# Patient Record
Sex: Male | Born: 1965 | Race: Black or African American | Hispanic: No | Marital: Single | Smoking: Current every day smoker
Health system: Southern US, Community
[De-identification: ages and names within clinical notes are randomized; demographics above are authoritative.]

---

## 2004-06-03 ENCOUNTER — Emergency Department: Payer: Self-pay | Admitting: General Practice

## 2007-02-14 ENCOUNTER — Emergency Department: Payer: Self-pay | Admitting: Emergency Medicine

## 2007-02-15 ENCOUNTER — Other Ambulatory Visit: Payer: Self-pay

## 2008-04-24 IMAGING — CR DG CHEST 1V
1 series · 1 of 1 positions shown · non-contrast
Comparison: none

REASON FOR EXAM: Dizziness
COMMENTS:

PROCEDURE:     DXR - DXR CHEST 1 VIEWAP OR PA  - February 15, 2007 [DATE]
RESULT:     The lung fields are clear. The heart, mediastinal and osseous
structures show no significant abnormalities.

[view not recorded]
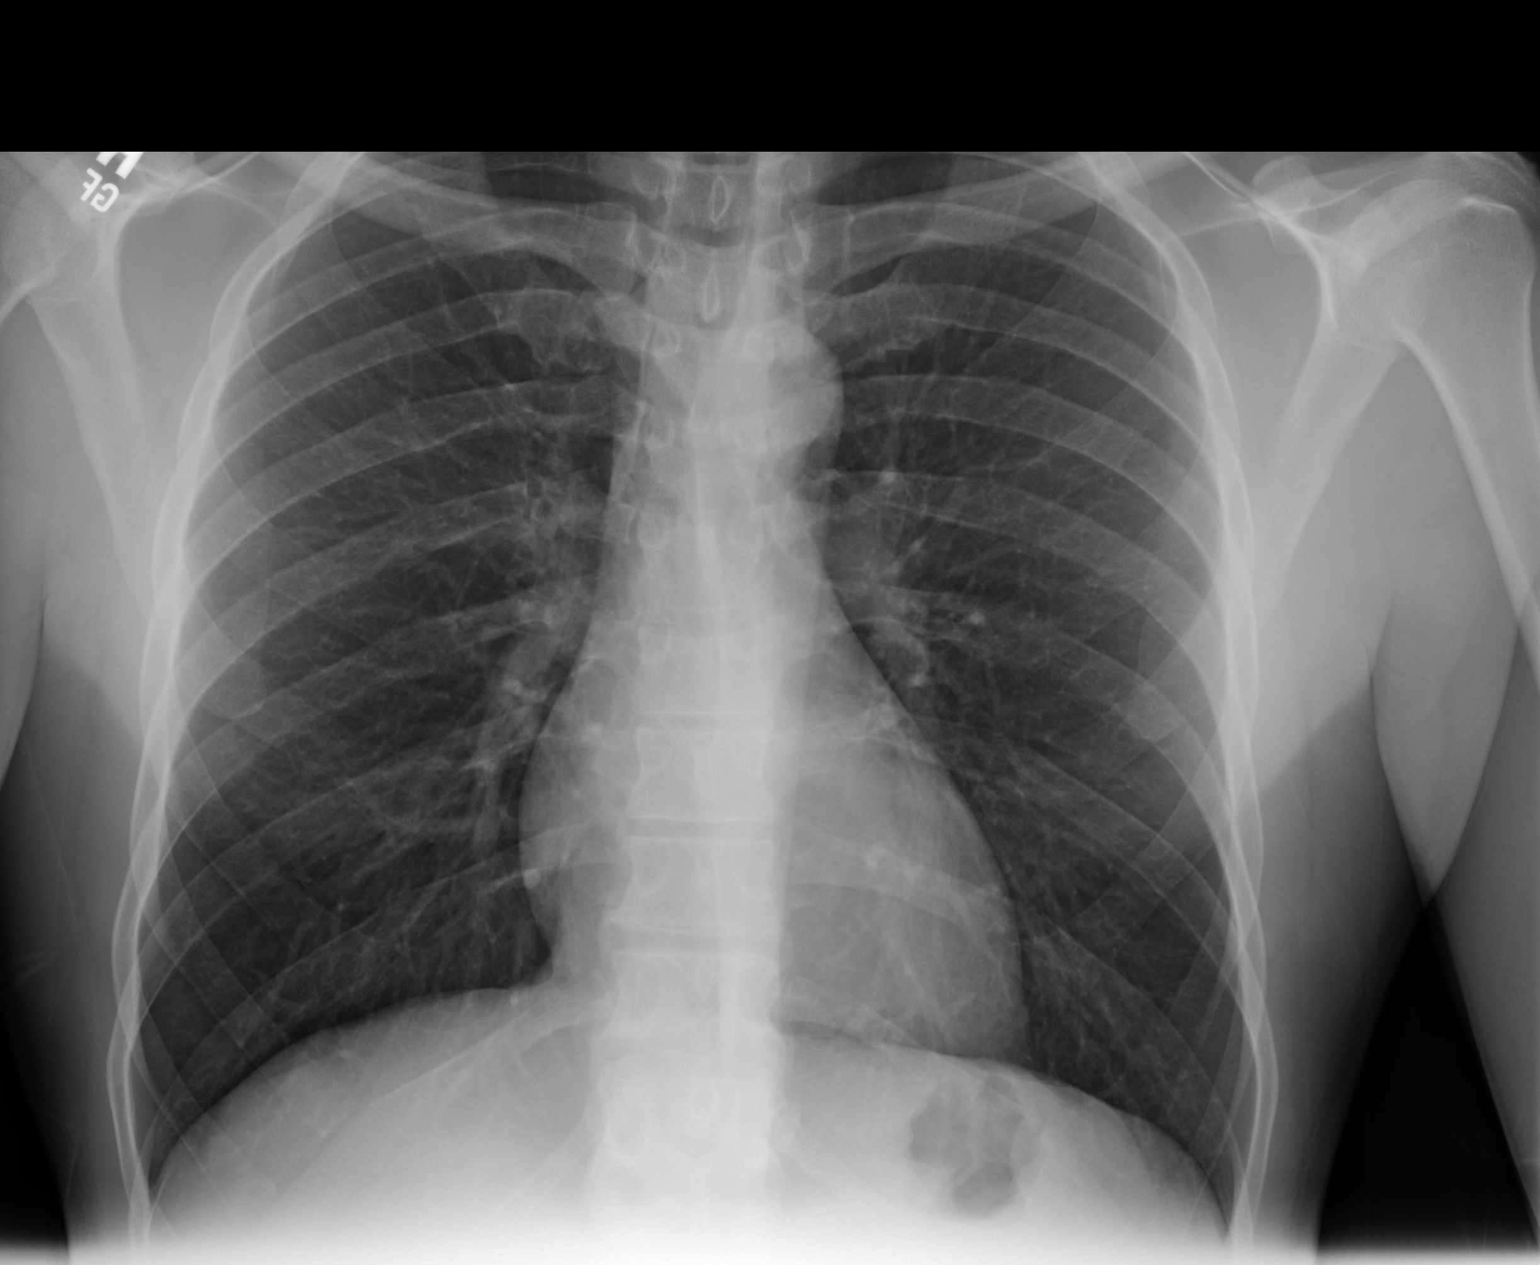

[1 of 1 positions shown; findings below may reference images not displayed]

IMPRESSION: No acute changes are identified.

## 2008-04-24 IMAGING — CT CT HEAD WITHOUT CONTRAST
1 series · 16 of 30 positions shown, 20 images · non-contrast
Comparison: none

REASON FOR EXAM: Vertigo
COMMENTS:

[Series 2: without · axial · non-contrast · 0.44mm/px · z∈[-28,+106]mm · 16 of 31 slices shown, 20 images]
[im 2/31  brain]
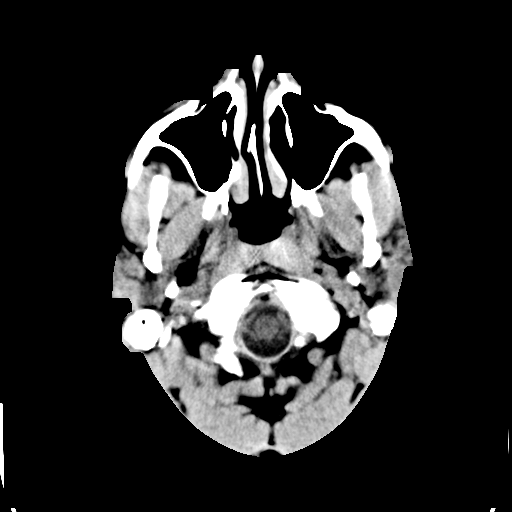
[im 2/31  bone]
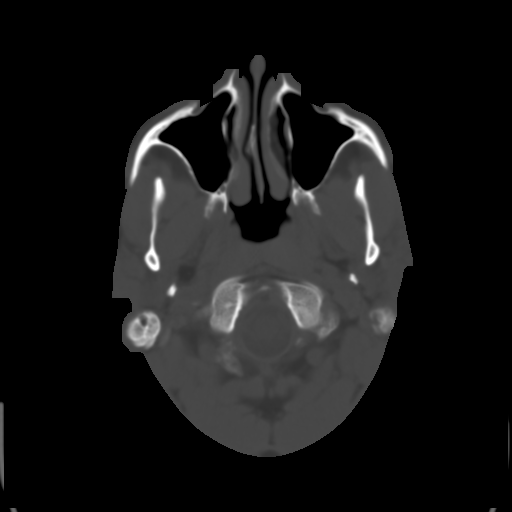
[im 4/31  brain]
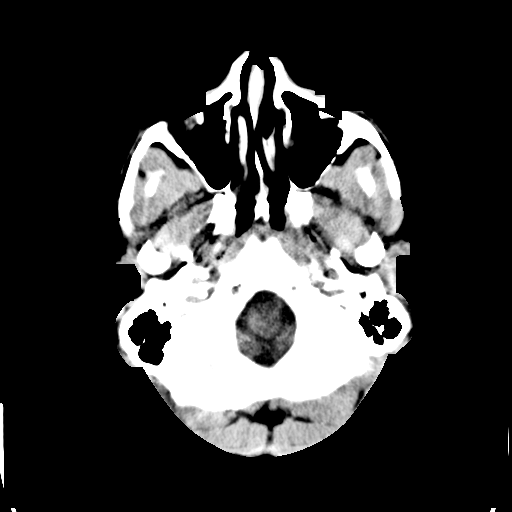
[im 6/31  brain]
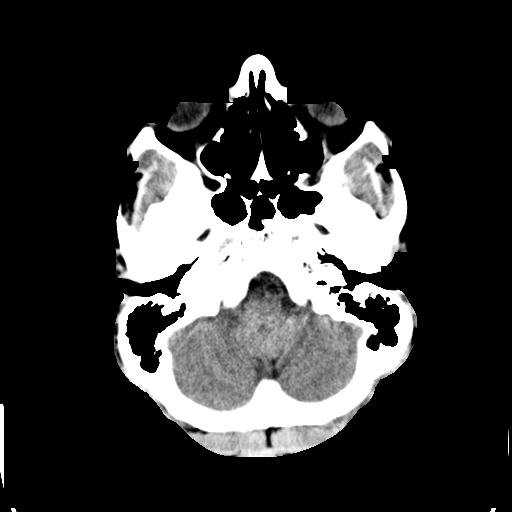
[im 8/31  brain]
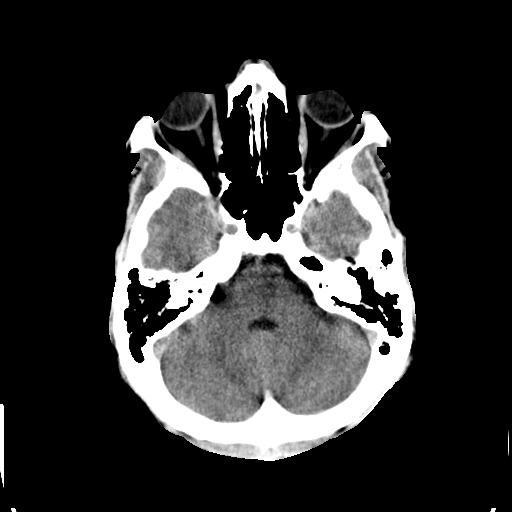
[im 9/31  brain]
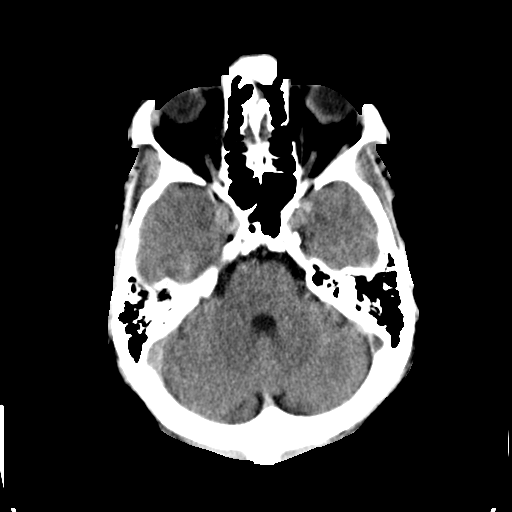
[im 9/31  bone]
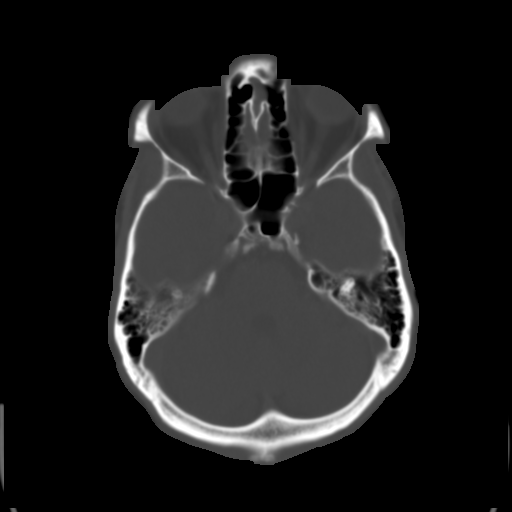
[im 11/31  brain]
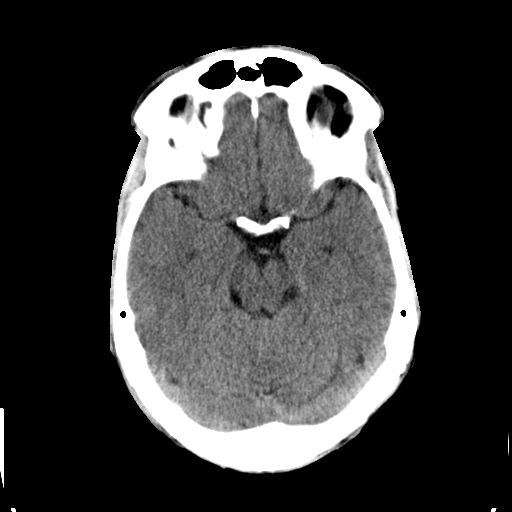
[im 13/31  brain]
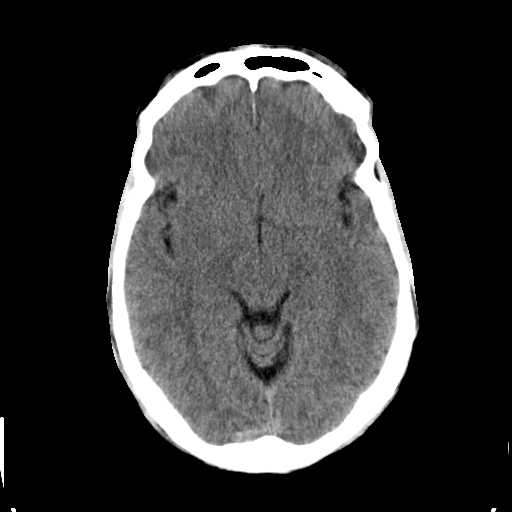
[im 15/31  brain]
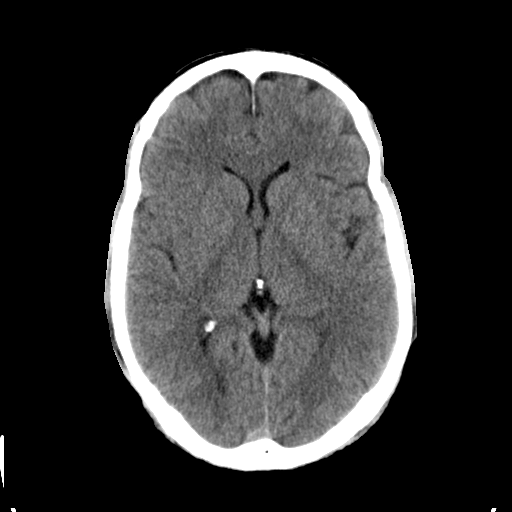
[im 16/31  brain]
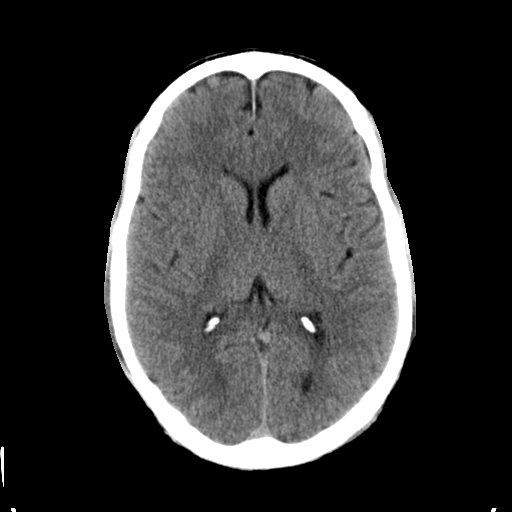
[im 16/31  bone]
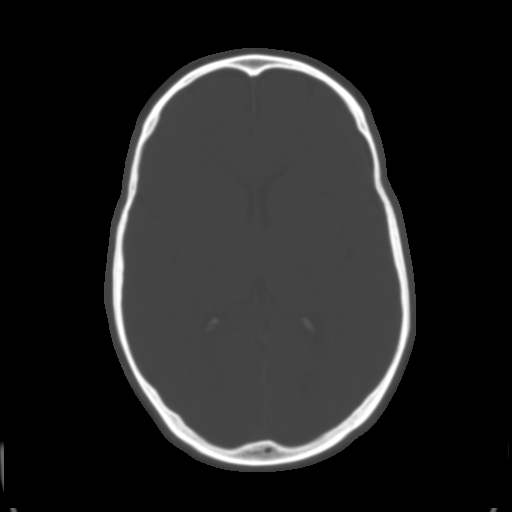
[im 18/31  brain]
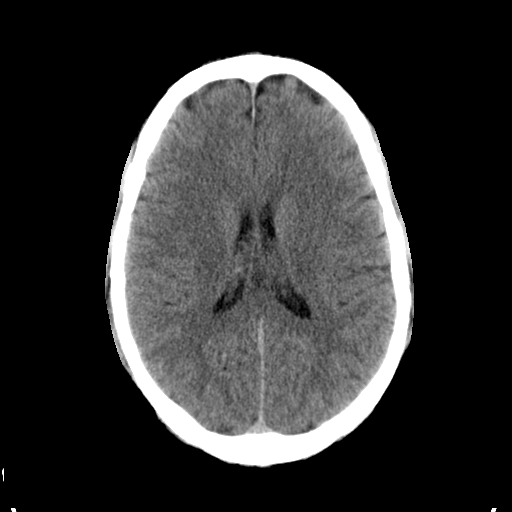
[im 20/31  brain]
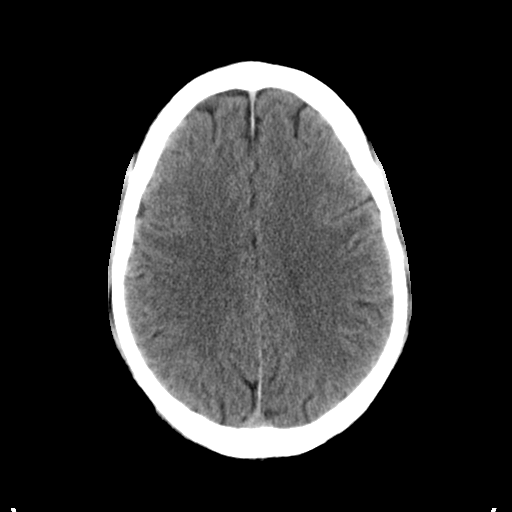
[im 22/31  brain]
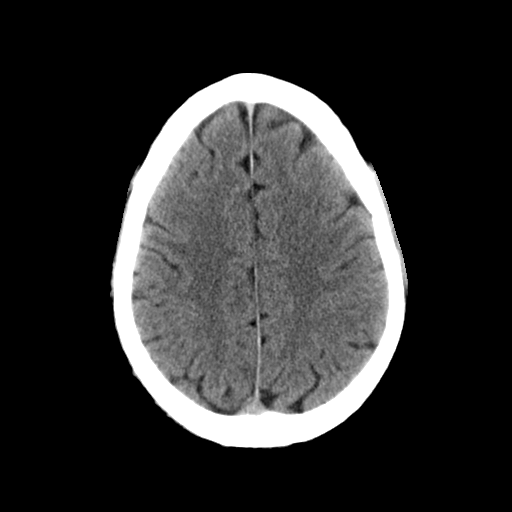
[im 23/31  brain]
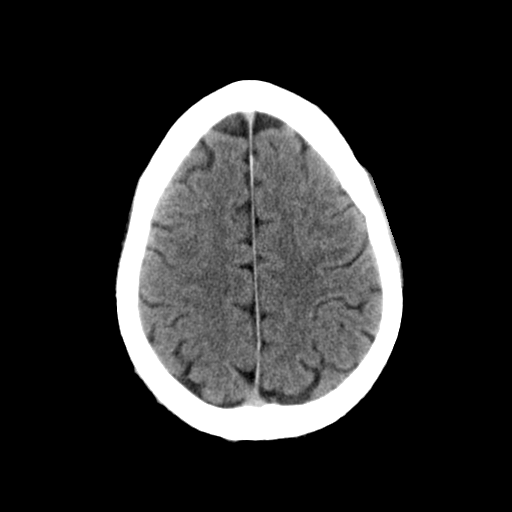
[im 23/31  bone]
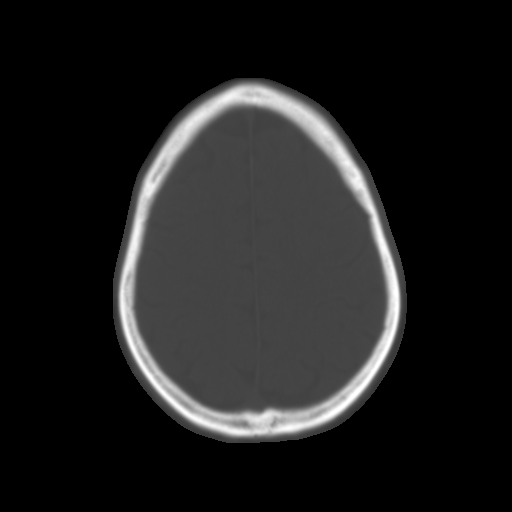
[im 25/31  brain]
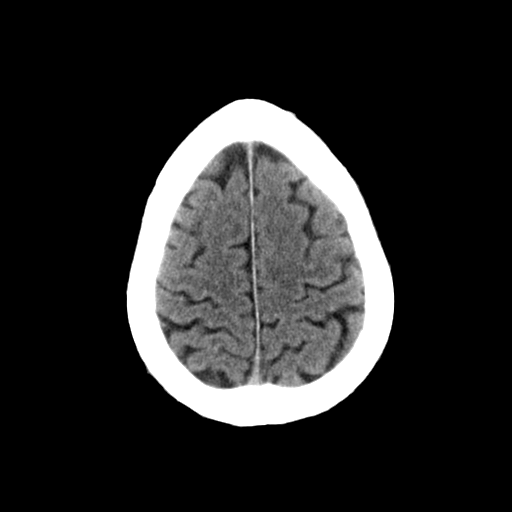
[im 27/31  brain]
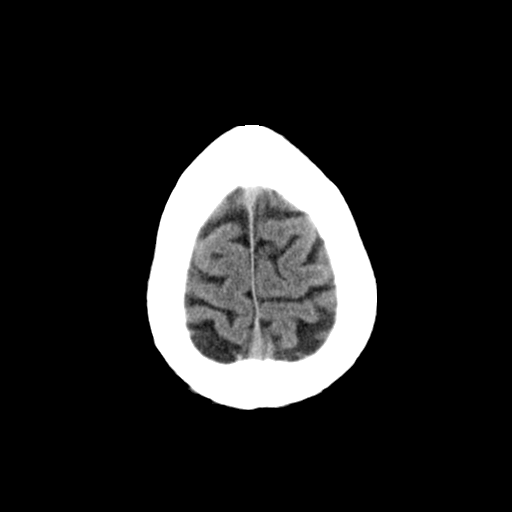
[im 29/31  brain]
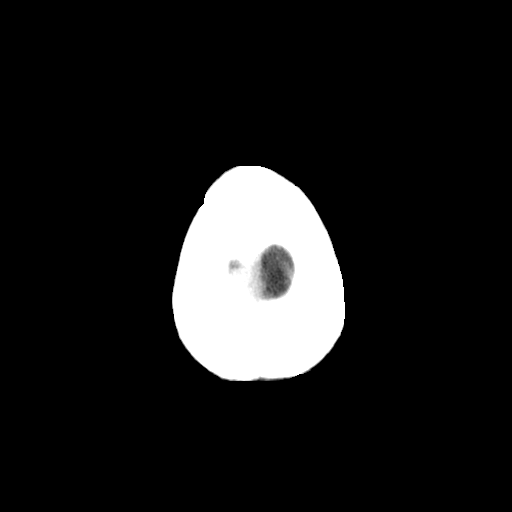

[16 of 30 positions shown; findings below may reference images not displayed]

PROCEDURE:     CT  - CT HEAD WITHOUT CONTRAST  - February 15, 2007 [DATE]

RESULT:     There is no evidence of intra-axial or extra-axial fluid
collections or evidence of acute hemorrhage. No secondary signs are
appreciated to suggest mass effect or subacute or chronic infarction.  The
visualized bony skeleton evaluated with bone windowing demonstrates no
evidence of fracture or dislocation.
IMPRESSION: Unremarkable head CT as described above.

## 2009-07-25 ENCOUNTER — Emergency Department: Payer: Self-pay | Admitting: Emergency Medicine

## 2009-07-27 ENCOUNTER — Emergency Department: Payer: Self-pay | Admitting: Emergency Medicine

## 2014-02-13 ENCOUNTER — Telehealth (HOSPITAL_COMMUNITY): Payer: Self-pay | Admitting: Vascular Surgery

## 2014-05-19 NOTE — Telephone Encounter (Signed)
Open in error

## 2020-04-02 ENCOUNTER — Ambulatory Visit: Payer: Self-pay

## 2022-08-01 ENCOUNTER — Other Ambulatory Visit: Payer: Self-pay

## 2022-08-01 ENCOUNTER — Emergency Department
Admission: EM | Admit: 2022-08-01 | Discharge: 2022-08-01 | Disposition: A | Discharge status: Home / Self Care | Payer: No Typology Code available for payment source | Attending: Emergency Medicine | Admitting: Emergency Medicine

## 2022-08-01 ENCOUNTER — Encounter: Payer: Self-pay | Admitting: Emergency Medicine

## 2022-08-01 DIAGNOSIS — S39012A Strain of muscle, fascia and tendon of lower back, initial encounter: Secondary | ICD-10-CM | POA: Insufficient documentation

## 2022-08-01 DIAGNOSIS — M545 Low back pain, unspecified: Secondary | ICD-10-CM | POA: Diagnosis present

## 2022-08-01 MED ORDER — NAPROXEN 500 MG PO TABS: 500.0000 mg | ORAL_TABLET | Freq: Two times a day (BID) | ORAL | 2 refills | Status: AC

## 2022-08-01 MED ORDER — METHOCARBAMOL 500 MG PO TABS: 500.0000 mg | ORAL_TABLET | Freq: Three times a day (TID) | ORAL | 0 refills | Status: AC | PRN
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
